# Patient Record
Sex: Male | Born: 1980 | Race: White | Hispanic: No | Marital: Single
Health system: Southern US, Community
[De-identification: ages and names within clinical notes are randomized; demographics above are authoritative.]

---

## 2019-12-15 ENCOUNTER — Emergency Department (HOSPITAL_COMMUNITY): Payer: BC Managed Care – PPO

## 2019-12-15 ENCOUNTER — Emergency Department (HOSPITAL_COMMUNITY)
Admission: EM | Admit: 2019-12-15 | Discharge: 2019-12-15 | Disposition: A | Discharge status: Home / Self Care | Payer: BC Managed Care – PPO | Attending: Emergency Medicine | Admitting: Emergency Medicine

## 2019-12-15 ENCOUNTER — Encounter (HOSPITAL_COMMUNITY): Payer: Self-pay | Admitting: Physician Assistant

## 2019-12-15 DIAGNOSIS — Y92480 Sidewalk as the place of occurrence of the external cause: Secondary | ICD-10-CM | POA: Insufficient documentation

## 2019-12-15 DIAGNOSIS — M542 Cervicalgia: Secondary | ICD-10-CM | POA: Diagnosis not present

## 2019-12-15 DIAGNOSIS — S0990XA Unspecified injury of head, initial encounter: Secondary | ICD-10-CM | POA: Diagnosis not present

## 2019-12-15 DIAGNOSIS — S80212A Abrasion, left knee, initial encounter: Secondary | ICD-10-CM | POA: Diagnosis not present

## 2019-12-15 DIAGNOSIS — T07XXXA Unspecified multiple injuries, initial encounter: Secondary | ICD-10-CM

## 2019-12-15 DIAGNOSIS — Z23 Encounter for immunization: Secondary | ICD-10-CM | POA: Insufficient documentation

## 2019-12-15 DIAGNOSIS — Y9389 Activity, other specified: Secondary | ICD-10-CM | POA: Insufficient documentation

## 2019-12-15 DIAGNOSIS — Y999 Unspecified external cause status: Secondary | ICD-10-CM | POA: Diagnosis not present

## 2019-12-15 DIAGNOSIS — S0081XA Abrasion of other part of head, initial encounter: Secondary | ICD-10-CM | POA: Diagnosis not present

## 2019-12-15 DIAGNOSIS — M25562 Pain in left knee: Secondary | ICD-10-CM

## 2019-12-15 DIAGNOSIS — S0993XA Unspecified injury of face, initial encounter: Secondary | ICD-10-CM | POA: Diagnosis present

## 2019-12-15 MED ORDER — TETANUS-DIPHTH-ACELL PERTUSSIS 5-2.5-18.5 LF-MCG/0.5 IM SUSP
0.5000 mL | Freq: Once | INTRAMUSCULAR | Status: AC
  Administered 2019-12-15: 0.5 mL via INTRAMUSCULAR
  Filled 2019-12-15: qty 0.5

## 2019-12-15 MED ORDER — ONDANSETRON 4 MG PO TBDP
4.0000 mg | ORAL_TABLET | Freq: Once | ORAL | Status: AC
  Administered 2019-12-15: 4 mg via ORAL
  Filled 2019-12-15: qty 1

## 2019-12-15 MED ORDER — OXYCODONE-ACETAMINOPHEN 5-325 MG PO TABS
1.0000 | ORAL_TABLET | Freq: Once | ORAL | Status: AC
  Administered 2019-12-15: 1 via ORAL
  Filled 2019-12-15: qty 1

## 2019-12-15 MED ORDER — HYDROCODONE-ACETAMINOPHEN 5-325 MG PO TABS
1.0000 | ORAL_TABLET | Freq: Once | ORAL | Status: AC
  Administered 2019-12-15: 1 via ORAL
  Filled 2019-12-15: qty 1
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
## 2019-12-15 NOTE — Discharge Instructions (Signed)
You may alternate taking Tylenol and Ibuprofen as needed for pain control. You may take 400-600 mg of ibuprofen every 6 hours and [phone] mg of Tylenol every 6 hours. Do not exceed 4000 mg of Tylenol daily as this can lead to liver damage. Also, make sure to take Ibuprofen with meals as it can cause an upset stomach. Do not take other NSAIDs while taking Ibuprofen such as (Aleve, Naprosyn, Aspirin, Celebrex, etc) and do not take more than the prescribed dose as this can lead to ulcers and bleeding in your GI tract. You may use warm and cold compresses to help with your symptoms.   Please avoid aspirin given your recent head injury.  Avoid any activities that could lead to reinjury.  Monitor your symptoms closely and ease back into your daily activities as tolerated.  Return for any severe headaches, vision changes, vomiting, numbness/weakness, dizziness/lightheadedness.  Please follow up with your primary doctor within the next 7-10 days for re-evaluation and further treatment of your symptoms.   Please return to the ER sooner if you have any new or worsening symptoms.
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
## 2019-12-15 NOTE — ED Notes (Signed)
Patient transported to X-ray 

## 2019-12-15 NOTE — ED Notes (Signed)
Patient transported to CT 

## 2020-11-15 ENCOUNTER — Other Ambulatory Visit: Payer: BC Managed Care – PPO

## 2020-11-15 DIAGNOSIS — Z20822 Contact with and (suspected) exposure to covid-19: Secondary | ICD-10-CM

## 2020-11-16 LAB — NOVEL CORONAVIRUS, NAA: SARS-CoV-2, NAA: NOT DETECTED

## 2020-11-16 LAB — SARS-COV-2, NAA 2 DAY TAT

## 2021-01-04 ENCOUNTER — Other Ambulatory Visit: Payer: Self-pay

## 2021-01-04 DIAGNOSIS — Z20822 Contact with and (suspected) exposure to covid-19: Secondary | ICD-10-CM

## 2021-01-06 LAB — SARS-COV-2, NAA 2 DAY TAT

## 2021-01-06 LAB — NOVEL CORONAVIRUS, NAA: SARS-CoV-2, NAA: NOT DETECTED

## 2021-01-17 ENCOUNTER — Other Ambulatory Visit: Payer: Self-pay
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
## 2021-01-18 ENCOUNTER — Other Ambulatory Visit: Payer: Self-pay

## 2021-01-18 DIAGNOSIS — Z20822 Contact with and (suspected) exposure to covid-19: Secondary | ICD-10-CM

## 2021-01-19 LAB — SARS-COV-2, NAA 2 DAY TAT

## 2021-01-19 LAB — NOVEL CORONAVIRUS, NAA: SARS-CoV-2, NAA: NOT DETECTED
